# Patient Record
Sex: Male | Born: 1990 | Race: Black or African American | Hispanic: No | Marital: Single | Smoking: Current every day smoker
Health system: Southern US, Community
[De-identification: ages and names within clinical notes are randomized; demographics above are authoritative.]

## PROBLEM LIST (undated history)

## (undated) DIAGNOSIS — I493 Ventricular premature depolarization: Secondary | ICD-10-CM

## (undated) DIAGNOSIS — R002 Palpitations: Secondary | ICD-10-CM

## (undated) HISTORY — DX: Palpitations: R00.2

## (undated) HISTORY — DX: Ventricular premature depolarization: I49.3

## (undated) HISTORY — PX: NO PAST SURGERIES: SHX2092

---

## 2012-03-25 ENCOUNTER — Emergency Department (HOSPITAL_COMMUNITY)
Admission: EM | Admit: 2012-03-25 | Discharge: 2012-03-25 | Disposition: A | Discharge status: Home / Self Care | Payer: No Typology Code available for payment source | Attending: Emergency Medicine | Admitting: Emergency Medicine

## 2012-03-25 ENCOUNTER — Encounter (HOSPITAL_COMMUNITY): Payer: Self-pay | Admitting: Physical Medicine and Rehabilitation

## 2012-03-25 DIAGNOSIS — F17200 Nicotine dependence, unspecified, uncomplicated: Secondary | ICD-10-CM | POA: Insufficient documentation

## 2012-03-25 DIAGNOSIS — M549 Dorsalgia, unspecified: Secondary | ICD-10-CM | POA: Insufficient documentation

## 2012-03-25 MED ORDER — IBUPROFEN 600 MG PO TABS: 600.0000 mg | ORAL_TABLET | Freq: Four times a day (QID) | ORAL | Status: AC | PRN

## 2012-03-25 MED ORDER — CYCLOBENZAPRINE HCL 10 MG PO TABS: 10.0000 mg | ORAL_TABLET | Freq: Two times a day (BID) | ORAL | Status: AC | PRN

## 2012-03-25 NOTE — ED Notes (Signed)
Patient states denies rib pain 0/10 and now pain right wrist improved 5/10 achy
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
## 2012-03-28 NOTE — ED Provider Notes (Signed)
Medical screening examination/treatment/procedure(s) were performed by non-physician practitioner and as supervising physician I was immediately available for consultation/collaboration.   Shelda Jakes, MD 03/28/12 [phone]

## 2012-09-07 ENCOUNTER — Emergency Department (HOSPITAL_COMMUNITY)
Admission: EM | Admit: 2012-09-07 | Discharge: 2012-09-07 | Disposition: A | Discharge status: Home / Self Care | Payer: Self-pay | Attending: Emergency Medicine | Admitting: Emergency Medicine

## 2012-09-07 ENCOUNTER — Emergency Department (HOSPITAL_COMMUNITY): Payer: Self-pay

## 2012-09-07 ENCOUNTER — Encounter (HOSPITAL_COMMUNITY): Payer: Self-pay | Admitting: Nurse Practitioner

## 2012-09-07 DIAGNOSIS — IMO0002 Reserved for concepts with insufficient information to code with codable children: Secondary | ICD-10-CM | POA: Insufficient documentation

## 2012-09-07 DIAGNOSIS — S99929A Unspecified injury of unspecified foot, initial encounter: Secondary | ICD-10-CM | POA: Insufficient documentation

## 2012-09-07 DIAGNOSIS — S8990XA Unspecified injury of unspecified lower leg, initial encounter: Secondary | ICD-10-CM | POA: Insufficient documentation

## 2012-09-07 DIAGNOSIS — F17200 Nicotine dependence, unspecified, uncomplicated: Secondary | ICD-10-CM | POA: Insufficient documentation

## 2012-09-07 MED ORDER — NAPROXEN 500 MG PO TABS: 500.0000 mg | ORAL_TABLET | Freq: Two times a day (BID) | ORAL | Status: DC

## 2012-09-07 MED ORDER — HYDROCODONE-ACETAMINOPHEN 5-325 MG PO TABS
2.0000 | ORAL_TABLET | Freq: Once | ORAL | Status: AC
  Administered 2012-09-07: 2 via ORAL
  Filled 2012-09-07: qty 2

## 2012-09-07 MED ORDER — CEPHALEXIN 500 MG PO CAPS: 500.0000 mg | ORAL_CAPSULE | Freq: Four times a day (QID) | ORAL | Status: DC
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
## 2012-09-07 NOTE — Progress Notes (Signed)
Orthopedic Tech Progress Note Patient Details:  Warren Jones Nov 19, 1990 [id number]  Ortho Devices Type of Ortho Device: ASO Ortho Device/Splint Location: (R) LE Ortho Device/Splint Interventions: Application   Jennye Moccasin 09/07/2012, 5:43 PM

## 2012-09-08 NOTE — ED Provider Notes (Signed)
Medical screening examination/treatment/procedure(s) were performed by non-physician practitioner and as supervising physician I was immediately available for consultation/collaboration.   Carleene Cooper III, MD 09/08/12 [phone]

## 2013-02-08 ENCOUNTER — Encounter (HOSPITAL_COMMUNITY): Payer: Self-pay | Admitting: Emergency Medicine

## 2013-02-08 ENCOUNTER — Emergency Department (HOSPITAL_COMMUNITY)
Admission: EM | Admit: 2013-02-08 | Discharge: 2013-02-08 | Disposition: A | Discharge status: Home / Self Care | Payer: Self-pay | Attending: Emergency Medicine | Admitting: Emergency Medicine

## 2013-02-08 DIAGNOSIS — A64 Unspecified sexually transmitted disease: Secondary | ICD-10-CM

## 2013-02-08 DIAGNOSIS — N489 Disorder of penis, unspecified: Secondary | ICD-10-CM | POA: Insufficient documentation

## 2013-02-08 DIAGNOSIS — B009 Herpesviral infection, unspecified: Secondary | ICD-10-CM

## 2013-02-08 DIAGNOSIS — A5900 Urogenital trichomoniasis, unspecified: Secondary | ICD-10-CM | POA: Insufficient documentation

## 2013-02-08 DIAGNOSIS — A599 Trichomoniasis, unspecified: Secondary | ICD-10-CM

## 2013-02-08 DIAGNOSIS — A6001 Herpesviral infection of penis: Secondary | ICD-10-CM | POA: Insufficient documentation

## 2013-02-08 LAB — URINALYSIS, ROUTINE W REFLEX MICROSCOPIC
Bilirubin Urine: NEGATIVE
Glucose, UA: NEGATIVE mg/dL
Hgb urine dipstick: NEGATIVE
Ketones, ur: NEGATIVE mg/dL
pH: 6 (ref 5.0–8.0)

## 2013-02-08 LAB — URINE MICROSCOPIC-ADD ON

## 2013-02-08 MED ORDER — ACYCLOVIR 400 MG PO TABS: 400.0000 mg | ORAL_TABLET | Freq: Four times a day (QID) | ORAL | Status: DC

## 2013-02-08 MED ORDER — AZITHROMYCIN 250 MG PO TABS
1000.0000 mg | ORAL_TABLET | Freq: Once | ORAL | Status: AC
  Administered 2013-02-08: 1000 mg via ORAL
  Filled 2013-02-08: qty 4

## 2013-02-08 MED ORDER — LIDOCAINE HCL (PF) 1 % IJ SOLN
INTRAMUSCULAR | Status: AC
  Administered 2013-02-08: 5 mL
  Filled 2013-02-08: qty 5

## 2013-02-08 MED ORDER — METRONIDAZOLE 500 MG PO TABS
2000.0000 mg | ORAL_TABLET | Freq: Once | ORAL | Status: AC
  Administered 2013-02-08: 2000 mg via ORAL
  Filled 2013-02-08 (×2): qty 4

## 2013-02-08 MED ORDER — DOXYCYCLINE HYCLATE 100 MG PO CAPS: 100.0000 mg | ORAL_CAPSULE | Freq: Two times a day (BID) | ORAL | Status: DC

## 2013-02-08 MED ORDER — CEFTRIAXONE SODIUM 250 MG IJ SOLR
250.0000 mg | Freq: Once | INTRAMUSCULAR | Status: AC
  Administered 2013-02-08: 250 mg via INTRAMUSCULAR
  Filled 2013-02-08: qty 250

## 2013-02-08 NOTE — ED Notes (Signed)
Pt reports, "I have a sexually transmitted disease." Pt found out today that his girlfriend has trichomonas, she found out today. Pt reports irritation to penis. Unknown if he has any discharge.

## 2013-02-08 NOTE — ED Provider Notes (Signed)
History  This chart was scribed for non-physician practitioner, Felipa Furnace, working with Gavin Pound. Oletta Lamas, MD by Ardeen Jourdain, ED Scribe. This patient was seen in room TR06C/TR06C and the patient's care was started at 1707.  CSN: [id number]     Arrival date & time 02/08/13  1646   First MD Initiated Contact with Patient 02/08/13 1707     Chief Complaint  Patient presents with  . SEXUALLY TRANSMITTED DISEASE    The history is provided by the patient. No language interpreter was used.    HPI Comments: Warren Jones is a 22 y.o. male who presents to the Emergency Department complaining of exposure to a STD. He states his girl friend was recently diagnosed with trichomonas and wants to be tested. He reports irration to his penis. Pt states he noticed a bump on the tip of his penis 1 month ago. He noticed several other painful bumps after that time. No reported dysuria or hematuria. He has not tried anything to alleviate his symptoms. He denies fever, chills, nausea and vomiting.  He is not sure if he has any discharge.    History reviewed. No pertinent past medical history. History reviewed. No pertinent past surgical history. No family history on file. History  Substance Use Topics  . Smoking status: Never Smoker   . Smokeless tobacco: Not on file  . Alcohol Use: No    Review of Systems  Genitourinary: Positive for penile pain.  All other systems reviewed and are negative.    Allergies  Review of patient's allergies indicates no known allergies.  Home Medications   Current Outpatient Rx  Name  Route  Sig  Dispense  Refill  . acetaminophen (TYLENOL) 325 MG tablet   Oral   Take 325 mg by mouth every 6 (six) hours as needed for pain (toothache).          Triage Vitals: BP 120/77  Pulse 66  Temp(Src) 98.2 F (36.8 C) (Oral)  Resp 20  Ht 6\' 3"  (1.905 m)  Wt 165 lb 9 oz (75.099 kg)  BMI 20.69 kg/m2  SpO2 98%  Physical Exam  Nursing note and vitals  reviewed. Constitutional: He is oriented to person, place, and time. He appears well-developed and well-nourished. No distress.  HENT:  Head: Normocephalic and atraumatic.  Eyes: EOM are normal.  Neck: Neck supple. No tracheal deviation present.  Cardiovascular: Normal rate.   Pulmonary/Chest: Effort normal. No respiratory distress.  Genitourinary:  No penile discharge. 2 small vesicles on an erythematous base characteristic of herpes as diagrammed. Mild tenderness to the posterior pole of the right testicle. No masses, lumps or lesions. no abnormality of the penile shaft. Circumcised    Musculoskeletal: Normal range of motion.  Neurological: He is alert and oriented to person, place, and time.  Skin: Skin is warm and dry. He is not diaphoretic.  Psychiatric: He has a normal mood and affect. His behavior is normal.    ED Course   Procedures (including critical care time)  DIAGNOSTIC STUDIES: Oxygen Saturation is 98% on room air, normal by my interpretation.    COORDINATION OF CARE:  5:21 PM-Discussed treatment plan which includes GC/Chlamydia swab and UA with pt at bedside and pt agreed to plan.    Labs Reviewed  URINALYSIS, ROUTINE W REFLEX MICROSCOPIC - Abnormal; Notable for the following:    Leukocytes, UA TRACE (*)    All other components within normal limits  GC/CHLAMYDIA PROBE AMP  URINE MICROSCOPIC-ADD ON  No results found. 1. Trichimoniasis   2. STD (male)   3. Herpes     MDM  Patient with penile discharge.  Treated in the ED. He also has herpes. He was given treatment for this as well. Stable and ready for discharge.   I personally performed the services described in this documentation, which was scribed in my presence. The recorded information has been reviewed and is accurate.    Roxy Horseman, PA-C 02/09/13 0022

## 2013-02-10 NOTE — ED Provider Notes (Signed)
Medical screening examination/treatment/procedure(s) were performed by non-physician practitioner and as supervising physician I was immediately available for consultation/collaboration.  Rhiley Solem Y. Roiza Wiedel, MD 02/10/13 2332 

## 2013-04-14 ENCOUNTER — Encounter (HOSPITAL_COMMUNITY): Payer: Self-pay | Admitting: Adult Health

## 2013-04-14 ENCOUNTER — Emergency Department (HOSPITAL_COMMUNITY)
Admission: EM | Admit: 2013-04-14 | Discharge: 2013-04-15 | Disposition: A | Discharge status: Home / Self Care | Payer: No Typology Code available for payment source | Attending: Emergency Medicine | Admitting: Emergency Medicine

## 2013-04-14 ENCOUNTER — Emergency Department (HOSPITAL_COMMUNITY): Payer: No Typology Code available for payment source

## 2013-04-14 DIAGNOSIS — N50812 Left testicular pain: Secondary | ICD-10-CM

## 2013-04-14 DIAGNOSIS — N509 Disorder of male genital organs, unspecified: Secondary | ICD-10-CM | POA: Insufficient documentation

## 2013-04-14 DIAGNOSIS — Z8619 Personal history of other infectious and parasitic diseases: Secondary | ICD-10-CM | POA: Insufficient documentation

## 2013-04-14 MED ORDER — DEXTROSE 5 % IV SOLN: 500.0000 mg | Freq: Once | INTRAVENOUS | Status: DC

## 2013-04-14 MED ORDER — CEFTRIAXONE SODIUM 250 MG IJ SOLR
250.0000 mg | Freq: Once | INTRAMUSCULAR | Status: AC
  Administered 2013-04-15: 250 mg via INTRAMUSCULAR
  Filled 2013-04-14: qty 250

## 2013-04-14 NOTE — ED Notes (Signed)
Pt states that he has not experienced any trauma to the left testicle, but has had unprotected sex within the past 30 days. Pt is unsure of how long ago. Pt denies swelling.

## 2013-04-14 NOTE — ED Provider Notes (Signed)
CSN: [id number]     Arrival date & time 04/14/13  2119 History  This chart was scribed for non-physician practitioner [name], PA-C working with [name], MD by [name], ED Scribe. This patient was seen in room TR04C/TR04C and the patient's care was started at 2119.    Chief Complaint  Patient presents with  . Testicle Pain   The history is provided by the patient. No language interpreter was used.    HPI Comments: Warren Jones is a 22 y.o. male who presents to the Emergency Department complaining of 1 day of gradual onset, constant, waxing and waning left testicular pain. He describes the pain as pinching, rates it as 1/10 at rest but worse with movement and walking. He has not taken any OTC medication for this pain. He reports recent unprotected sex. He has a history of genital herpes but denies taking any antivirals. He denies any penile discharge, dysuria, frequency, abdominal pain, nausea, emesis, fever.   History reviewed. No pertinent past medical history. History reviewed. No pertinent past surgical history. History reviewed. No pertinent family history. History  Substance Use Topics  . Smoking status: Never Smoker   . Smokeless tobacco: Not on file  . Alcohol Use: No    Review of Systems  Constitutional: Negative for fever.  Gastrointestinal: Negative for nausea, vomiting and abdominal pain.  Genitourinary: Positive for testicular pain. Negative for dysuria, frequency and discharge.  All other systems reviewed and are negative.    Allergies  Review of patient's allergies indicates no known allergies.  Home Medications  No current outpatient prescriptions on file. BP 126/90  Pulse 78  Temp(Src) 99 F (37.2 C) (Oral)  Resp 16  Ht 6\' 3"  (1.905 m)  Wt 165 lb (74.844 kg)  BMI 20.62 kg/m2  SpO2 100% Physical Exam  Nursing note and vitals reviewed. Constitutional: He is oriented to person, place, and time. He appears well-developed and  well-nourished.  HENT:  Head: Normocephalic and atraumatic.  Eyes: EOM are normal.  Neck: Normal range of motion.  Cardiovascular: Normal rate.   Pulmonary/Chest: Effort normal.  Abdominal: Soft. Bowel sounds are normal. He exhibits no distension. There is no tenderness. Hernia confirmed negative in the right inguinal area and confirmed negative in the left inguinal area.  Genitourinary: Penis normal. Right testis shows no mass, no swelling and no tenderness. Right testis is descended. Cremasteric reflex is not absent on the right side. Left testis shows tenderness. Left testis shows no mass and no swelling. Left testis is descended. Cremasteric reflex is not absent on the left side. Circumcised. No phimosis, paraphimosis, hypospadias, penile erythema or penile tenderness. No discharge found.  Left testicle appears to sit higher than right (pt states new for him), moderate TTP. No erythema, ecchymosis or edema. No lesions. No inguinal hernia.  Musculoskeletal: Normal range of motion.  Lymphadenopathy:       Right: No inguinal adenopathy present.       Left: No inguinal adenopathy present.  Neurological: He is alert and oriented to person, place, and time.  Skin: Skin is warm and dry.  Psychiatric: He has a normal mood and affect. His behavior is normal.    ED Course  Procedures   DIAGNOSTIC STUDIES: Oxygen Saturation is 100% on RA, normal by my interpretation.    COORDINATION OF CARE: 11:59 PM Discussed treatment plan with pt at bedside and pt agreed to plan.   Labs Review Labs Reviewed  GC/CHLAMYDIA PROBE AMP   Imaging Review  US Scrotum  04/14/2013   CLINICAL DATA:  Testicular pain.  EXAM: SCROTAL ULTRASOUND  DOPPLER ULTRASOUND OF THE TESTICLES  TECHNIQUE: Complete ultrasound examination of the testicles, epididymis, and other scrotal structures was performed. Color and spectral Doppler ultrasound were also utilized to evaluate blood flow to the testicles.  COMPARISON:  None.   FINDINGS: Right testis: 4.0 x 1.9 x 3.1 cm. Normal echotexture. No focal abnormality. Normal arterial and venous blood flow.  Left testis: 3.5 x 1.7 x 2.3 cm. Normal echotexture. No focal abnormality. Normal arterial and venous blood flow.  Right epididymis:  Normal in size and appearance.  Left epididymis:  Normal in size and appearance.  Hydrocele:  Absent  Varicocele:  Present on the left.  Pulsed Doppler interrogation of both testes demonstrates low resistance arterial and venous waveforms bilaterally.  IMPRESSION: Testes symmetric and unremarkable.  Mild left varicocele.   Electronically Signed   By: Charlett Nose M.D.   On: 04/14/2013 23:44   [place] Art/ven Flow Abd Pelv Doppler  04/14/2013   CLINICAL DATA:  Testicular pain.  EXAM: SCROTAL ULTRASOUND  DOPPLER ULTRASOUND OF THE TESTICLES  TECHNIQUE: Complete ultrasound examination of the testicles, epididymis, and other scrotal structures was performed. Color and spectral Doppler ultrasound were also utilized to evaluate blood flow to the testicles.  COMPARISON:  None.  FINDINGS: Right testis: 4.0 x 1.9 x 3.1 cm. Normal echotexture. No focal abnormality. Normal arterial and venous blood flow.  Left testis: 3.5 x 1.7 x 2.3 cm. Normal echotexture. No focal abnormality. Normal arterial and venous blood flow.  Right epididymis:  Normal in size and appearance.  Left epididymis:  Normal in size and appearance.  Hydrocele:  Absent  Varicocele:  Present on the left.  Pulsed Doppler interrogation of both testes demonstrates low resistance arterial and venous waveforms bilaterally.  IMPRESSION: Testes symmetric and unremarkable.  Mild left varicocele.   Electronically Signed   By: Charlett Nose M.D.   On: 04/14/2013 23:44    MDM   1. Testicular pain, left    Pt reports hx of unprotected sex and hx of genital herpes. Reports left testicular pain that started yesterday. Left testicle is TTP, no edema. Sitting higher than right (new per pt since onset of pain).   Testicular US: testes symmetric and unremarkable, mild left varicocele.  No evidence of testicular torsion.  Will tx empirically for GC/chlamydia: Azithromycin and rocephin.  Swab sent for culture.  Discussed no sexual intercourse for 7 days. Have all partner(s) tested and treated. All labs/imaging/findings discussed with patient. All questions answered and concerns addressed. Will discharge pt home and have pt f/u with [facility] info provided. Return precautions given. Pt verbalized understanding and agreement with tx plan. Vitals: unremarkable. Discharged in stable condition.    Discussed pt with attending during ED encounter and agrees with plan.   I personally performed the services described in this documentation, which was scribed in my presence. The recorded information has been reviewed and is accurate.    [name], PA-C 04/15/13 0003

## 2013-04-14 NOTE — ED Notes (Signed)
Presents with left testicle pain, denies swelling. Pain rated 3./10, worse with movement. Pain does not radiate. Reports unprotected sex recently. Denies pain with urination,. Denies penile discharge.

## 2013-04-15 MED ORDER — LIDOCAINE HCL (PF) 1 % IJ SOLN
INTRAMUSCULAR | Status: AC
  Administered 2013-04-15: 1 mL
  Filled 2013-04-15: qty 5

## 2013-04-15 MED ORDER — AZITHROMYCIN 250 MG PO TABS
1000.0000 mg | ORAL_TABLET | Freq: Once | ORAL | Status: AC
  Administered 2013-04-15: 1000 mg via ORAL
  Filled 2013-04-15: qty 4

## 2013-04-15 NOTE — ED Provider Notes (Signed)
Medical screening examination/treatment/procedure(s) were performed by non-physician practitioner and as supervising physician I was immediately available for consultation/collaboration.   [name], MD 04/15/13 1154

## 2013-04-16 LAB — GC/CHLAMYDIA PROBE AMP
CT Probe RNA: NEGATIVE
GC Probe RNA: NEGATIVE

## 2013-12-13 ENCOUNTER — Encounter (HOSPITAL_COMMUNITY): Payer: Self-pay | Admitting: Emergency Medicine

## 2013-12-13 ENCOUNTER — Emergency Department (HOSPITAL_COMMUNITY)
Admission: EM | Admit: 2013-12-13 | Discharge: 2013-12-14 | Disposition: A | Discharge status: Home / Self Care | Payer: Self-pay | Attending: Emergency Medicine | Admitting: Emergency Medicine

## 2013-12-13 ENCOUNTER — Emergency Department (HOSPITAL_COMMUNITY): Payer: No Typology Code available for payment source

## 2013-12-13 DIAGNOSIS — Z23 Encounter for immunization: Secondary | ICD-10-CM | POA: Insufficient documentation

## 2013-12-13 DIAGNOSIS — IMO0002 Reserved for concepts with insufficient information to code with codable children: Secondary | ICD-10-CM

## 2013-12-13 DIAGNOSIS — T148XXA Other injury of unspecified body region, initial encounter: Secondary | ICD-10-CM

## 2013-12-13 DIAGNOSIS — S01409A Unspecified open wound of unspecified cheek and temporomandibular area, initial encounter: Secondary | ICD-10-CM | POA: Insufficient documentation

## 2013-12-13 LAB — CBC WITH DIFFERENTIAL/PLATELET
BASOS ABS: 0 10*3/uL (ref 0.0–0.1)
BASOS PCT: 0 % (ref 0–1)
Eosinophils Absolute: 0.1 10*3/uL (ref 0.0–0.7)
Eosinophils Relative: 2 % (ref 0–5)
HEMATOCRIT: 35.1 % — AB (ref 39.0–52.0)
HEMOGLOBIN: 12.5 g/dL — AB (ref 13.0–17.0)
LYMPHS PCT: 34 % (ref 12–46)
Lymphs Abs: 1.3 10*3/uL (ref 0.7–4.0)
MCH: 34.7 pg — ABNORMAL HIGH (ref 26.0–34.0)
MCHC: 35.6 g/dL (ref 30.0–36.0)
MCV: 97.5 fL (ref 78.0–100.0)
MONO ABS: 0.2 10*3/uL (ref 0.1–1.0)
MONOS PCT: 5 % (ref 3–12)
NEUTROS ABS: 2.3 10*3/uL (ref 1.7–7.7)
NEUTROS PCT: 59 % (ref 43–77)
Platelets: 130 10*3/uL — ABNORMAL LOW (ref 150–400)
RBC: 3.6 MIL/uL — ABNORMAL LOW (ref 4.22–5.81)
RDW: 12.1 % (ref 11.5–15.5)
WBC: 4 10*3/uL (ref 4.0–10.5)

## 2013-12-13 LAB — PROTIME-INR
INR: 1.13 (ref 0.00–1.49)
Prothrombin Time: 14.3 seconds (ref 11.6–15.2)

## 2013-12-13 LAB — COMPREHENSIVE METABOLIC PANEL
ALBUMIN: 3.9 g/dL (ref 3.5–5.2)
ALK PHOS: 49 U/L (ref 39–117)
ALT: 9 U/L (ref 0–53)
AST: 19 U/L (ref 0–37)
BILIRUBIN TOTAL: 0.3 mg/dL (ref 0.3–1.2)
BUN: 9 mg/dL (ref 6–23)
CHLORIDE: 104 meq/L (ref 96–112)
CO2: 23 meq/L (ref 19–32)
Calcium: 8.7 mg/dL (ref 8.4–10.5)
Creatinine, Ser: 0.9 mg/dL (ref 0.50–1.35)
GFR calc Af Amer: >90 mL/min (ref >90)
Glucose, Bld: 94 mg/dL (ref 70–99)
POTASSIUM: 3.6 meq/L — AB (ref 3.7–5.3)
Sodium: 140 mEq/L (ref 137–147)
Total Protein: 7.2 g/dL (ref 6.0–8.3)

## 2013-12-13 MED ORDER — HYDROMORPHONE HCL PF 1 MG/ML IJ SOLN
INTRAMUSCULAR | Status: AC
  Administered 2013-12-13: 1 mg
  Filled 2013-12-13: qty 1

## 2013-12-13 MED ORDER — TETANUS-DIPHTHERIA TOXOIDS TD 5-2 LFU IM INJ: 0.5000 mL | INJECTION | Freq: Once | INTRAMUSCULAR | Status: DC

## 2013-12-13 MED ORDER — HYDROCODONE-ACETAMINOPHEN 5-325 MG PO TABS
1.0000 | ORAL_TABLET | Freq: Once | ORAL | Status: AC
  Administered 2013-12-14: 1 via ORAL
  Filled 2013-12-13: qty 1

## 2013-12-13 MED ORDER — HYDROMORPHONE HCL PF 1 MG/ML IJ SOLN
1.0000 mg | Freq: Once | INTRAMUSCULAR | Status: AC
  Administered 2013-12-13: 1 mg via INTRAVENOUS
  Filled 2013-12-13: qty 1

## 2013-12-13 MED ORDER — TETANUS-DIPHTH-ACELL PERTUSSIS 5-2.5-18.5 LF-MCG/0.5 IM SUSP: 0.5000 mL | Freq: Once | INTRAMUSCULAR | Status: DC

## 2013-12-13 MED ORDER — IOHEXOL 350 MG/ML SOLN: 100.0000 mL | Freq: Once | INTRAVENOUS | Status: DC | PRN

## 2013-12-13 MED ORDER — ONDANSETRON HCL 4 MG/2ML IJ SOLN
4.0000 mg | Freq: Once | INTRAMUSCULAR | Status: AC
  Administered 2013-12-13: 4 mg via INTRAVENOUS
  Filled 2013-12-13: qty 2

## 2013-12-13 MED ORDER — TETANUS-DIPHTHERIA TOXOIDS TD 5-2 LFU IM INJ
0.5000 mL | INJECTION | Freq: Once | INTRAMUSCULAR | Status: DC
Start: 2013-12-13 — End: 2013-12-13

## 2013-12-13 MED ORDER — IOHEXOL 350 MG/ML SOLN
50.0000 mL | Freq: Once | INTRAVENOUS | Status: AC | PRN
  Administered 2013-12-13: 50 mL via INTRAVENOUS

## 2013-12-13 MED ORDER — TETANUS-DIPHTH-ACELL PERTUSSIS 5-2.5-18.5 LF-MCG/0.5 IM SUSP
0.5000 mL | Freq: Once | INTRAMUSCULAR | Status: AC
  Administered 2013-12-13: 0.5 mL via INTRAMUSCULAR

## 2013-12-13 MED ORDER — SODIUM CHLORIDE 0.9 % IV BOLUS (SEPSIS)
1000.0000 mL | Freq: Once | INTRAVENOUS | Status: AC
  Administered 2013-12-13: 1000 mL via INTRAVENOUS

## 2013-12-13 NOTE — ED Notes (Signed)
EMS VS 132/89, 94HR, 18 RR. No allergies
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
## 2013-12-13 NOTE — ED Notes (Signed)
C-collar removed by Dr. Denton Lank EDP

## 2013-12-13 NOTE — ED Notes (Signed)
MD at bedside. 
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
## 2013-12-14 NOTE — ED Notes (Signed)
Pt's necklace taken of at this time and placed in specimen cup, labeled with pt sticker and left at bedside
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
## 2013-12-20 ENCOUNTER — Emergency Department (HOSPITAL_COMMUNITY)
Admission: EM | Admit: 2013-12-20 | Discharge: 2013-12-20 | Disposition: A | Discharge status: Home / Self Care | Payer: No Typology Code available for payment source | Attending: Emergency Medicine | Admitting: Emergency Medicine

## 2013-12-20 ENCOUNTER — Encounter (HOSPITAL_COMMUNITY): Payer: Self-pay | Admitting: Emergency Medicine

## 2013-12-20 DIAGNOSIS — Z4802 Encounter for removal of sutures: Secondary | ICD-10-CM | POA: Insufficient documentation

## 2013-12-20 NOTE — ED Notes (Signed)
Pt presents with c/o stitch removal and wound check. Pt says that he has seen some pus coming from the wound and wanted to follow-up.

## 2013-12-20 NOTE — ED Provider Notes (Signed)
CSN: [id number]     Arrival date & time 12/20/13  2203 History   First MD Initiated Contact with Patient 12/20/13 2237    This chart was scribed for non-physician practitioner, Ebbie Ridge, PA, working with Hurman Horn, MD by Marica Otter, ED Scribe. This patient was seen in room WTR7/WTR7 and the patient's care was started at 10:50 PM.  Chief Complaint  Patient presents with  . Wound Check  . Suture / Staple Removal   HPI HPI Comments: Warren Jones is a 23 y.o. male who presents to the Emergency Department for stitch removal and wound check of his head and neck. Pt also complains of some discharge from the wound. PT does not know if he is UTD on his tetanus shot.   History reviewed. No pertinent past medical history. History reviewed. No pertinent past surgical history. No family history on file. History  Substance Use Topics  . Smoking status: Never Smoker   . Smokeless tobacco: Not on file  . Alcohol Use: No    Review of Systems  Skin: Positive for wound (wound check and stitch removal head and neck ).    A complete 10 system review of systems was obtained and all systems are negative except as noted in the HPI and PMH.    Allergies  Review of patient's allergies indicates no known allergies.  Home Medications   Prior to Admission medications   Not on File   Triage Vitals: BP 114/65  Pulse 73  Temp(Src) 97.8 F (36.6 C) (Oral)  Resp 18  SpO2 99%  Physical Exam  Nursing note and vitals reviewed. Constitutional: He is oriented to person, place, and time. He appears well-developed and well-nourished. No distress.  HENT:  Head: Normocephalic.  Patient has a well-healing laceration to the preauricular region on the left  Neck: Normal range of motion.  Cardiovascular: Normal rate.   Pulmonary/Chest: Effort normal. No respiratory distress.  Neurological: He is alert and oriented to person, place, and time.  Skin: Skin is warm and dry.    ED Course   Procedures (including critical care time) DIAGNOSTIC STUDIES: Oxygen Saturation is 99% on RA, normal by my interpretation.    COORDINATION OF CARE: 10:52 PM-Discussed treatment plan which includes steri strip care in the middle of the wound and suture removals with pt at bedside and pt agreed to plan.    I placed 3 Steri-Strips in the central portion of the wound.  The area was still somewhat unhealed.  I feel there was an end of  the suture that was underneath the skin that is causing the wound not heal in that area  [name], PA-C 12/21/13 [phone]

## 2013-12-20 NOTE — Discharge Instructions (Signed)
Return here as needed. Use scar cream on the area once it heals completely

## 2013-12-21 NOTE — ED Provider Notes (Signed)
Medical screening examination/treatment/procedure(s) were performed by non-physician practitioner and as supervising physician I was immediately available for consultation/collaboration.   EKG Interpretation None       Hurman Horn, MD 12/21/13 1425

## 2014-07-31 ENCOUNTER — Emergency Department (HOSPITAL_COMMUNITY)
Admission: EM | Admit: 2014-07-31 | Discharge: 2014-07-31 | Disposition: A | Discharge status: Home / Self Care | Payer: Self-pay | Attending: Emergency Medicine | Admitting: Emergency Medicine

## 2014-07-31 ENCOUNTER — Emergency Department (HOSPITAL_COMMUNITY): Payer: Self-pay

## 2014-07-31 ENCOUNTER — Encounter (HOSPITAL_COMMUNITY): Payer: Self-pay | Admitting: Emergency Medicine

## 2014-07-31 DIAGNOSIS — L989 Disorder of the skin and subcutaneous tissue, unspecified: Secondary | ICD-10-CM | POA: Insufficient documentation

## 2014-07-31 DIAGNOSIS — S60921A Unspecified superficial injury of right hand, initial encounter: Secondary | ICD-10-CM | POA: Insufficient documentation

## 2014-07-31 DIAGNOSIS — Z720 Tobacco use: Secondary | ICD-10-CM | POA: Insufficient documentation

## 2014-07-31 DIAGNOSIS — L089 Local infection of the skin and subcutaneous tissue, unspecified: Secondary | ICD-10-CM

## 2014-07-31 DIAGNOSIS — Y998 Other external cause status: Secondary | ICD-10-CM | POA: Insufficient documentation

## 2014-07-31 DIAGNOSIS — Y9239 Other specified sports and athletic area as the place of occurrence of the external cause: Secondary | ICD-10-CM | POA: Insufficient documentation

## 2014-07-31 DIAGNOSIS — X58XXXA Exposure to other specified factors, initial encounter: Secondary | ICD-10-CM | POA: Insufficient documentation

## 2014-07-31 DIAGNOSIS — J029 Acute pharyngitis, unspecified: Secondary | ICD-10-CM | POA: Insufficient documentation

## 2014-07-31 DIAGNOSIS — Y9371 Activity, boxing: Secondary | ICD-10-CM | POA: Insufficient documentation

## 2014-07-31 LAB — RAPID STREP SCREEN (MED CTR MEBANE ONLY): Streptococcus, Group A Screen (Direct): NEGATIVE

## 2014-07-31 MED ORDER — HYDROCODONE-ACETAMINOPHEN 5-325 MG PO TABS: 1.0000 | ORAL_TABLET | ORAL | Status: DC | PRN

## 2014-07-31 MED ORDER — AMOXICILLIN-POT CLAVULANATE 875-125 MG PO TABS
1.0000 | ORAL_TABLET | Freq: Two times a day (BID) | ORAL | Status: DC
Start: 2014-07-31 — End: 2016-04-25

## 2014-07-31 MED ORDER — AMOXICILLIN-POT CLAVULANATE 875-125 MG PO TABS
1.0000 | ORAL_TABLET | Freq: Once | ORAL | Status: AC
  Administered 2014-07-31: 1 via ORAL
  Filled 2014-07-31: qty 1

## 2014-07-31 MED ORDER — HYDROCODONE-ACETAMINOPHEN 5-325 MG PO TABS
2.0000 | ORAL_TABLET | Freq: Once | ORAL | Status: AC
  Administered 2014-07-31: 2 via ORAL
  Filled 2014-07-31: qty 2

## 2014-07-31 NOTE — ED Notes (Signed)
Pt at xray
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
## 2014-07-31 NOTE — Discharge Instructions (Signed)
Return to the emergency room with worsening of symptoms, new symptoms or with symptoms that are concerning, especially fevers, increased redness, swelling, pus from the wound, nausea, vomiting OR unable to open your mouth fully, drooling, facial, lip, tongue swelling, unable to swallow, difficulty breathing. Call to make an appointment with the hand surgeon as soon as possible. If you cannot be seen in 2 days come back to the ED for wound/infection reevaluation. Please take all of your antibiotics until finished!   You may develop abdominal discomfort or diarrhea from the antibiotic.  You may help offset this with probiotics which you can buy or get in yogurt. Do not eat  or take the probiotics until 2 hours after your antibiotic.  Drink plenty of fluids with electrolytes especially Gatorade. OTC cold medications such as mucinex, nyquil, dayquil are recommended. Chloraseptic for sore throat. Repeat low information and follow all instructions.  Wound Infection A wound infection happens when a type of germ (bacteria) starts growing in the wound. In some cases, this can cause the wound to break open. If cared for properly, the infected wound will heal from the inside to the outside. Wound infections need treatment. CAUSES An infection is caused by bacteria growing in the wound.  SYMPTOMS   Increase in redness, swelling, or pain at the wound site.  Increase in drainage at the wound site.  Wound or bandage (dressing) starts to smell bad.  Fever.  Feeling tired or fatigued.  Pus draining from the wound. TREATMENT  Your health care provider will prescribe antibiotic medicine. The wound infection should improve within 24 to 48 hours. Any redness around the wound should stop spreading and the wound should be less painful.  HOME CARE INSTRUCTIONS   Only take over-the-counter or prescription medicines for pain, discomfort, or fever as directed by your health care provider.  Take your antibiotics as  directed. Finish them even if you start to feel better.  Gently wash the area with mild soap and water 2 times a day, or as directed. Rinse off the soap. Pat the area dry with a clean towel. Do not rub the wound. This may cause bleeding.  Follow your health care provider's instructions for how often you need to change the dressing.  Apply ointment and a dressing to the wound as directed.  If the dressing sticks, moisten it with soapy water and gently remove it.  Change the bandage right away if it becomes wet, dirty, or develops a bad smell.  Take showers. Do not take tub baths, swim, or do anything that may soak the wound until it is healed.  Avoid exercises that make you sweat heavily.  Use anti-itch medicine as directed by your health care provider. The wound may itch when it is healing. Do not pick or scratch at the wound.  Follow up with your health care provider to get your wound rechecked as directed. SEEK MEDICAL CARE IF:  You have an increase in swelling, pain, or redness around the wound.  You have an increase in the amount of pus coming from the wound.  There is a bad smell coming from the wound.  More of the wound breaks open.  You have a fever. MAKE SURE YOU:   Understand these instructions.  Will watch your condition.  Will get help right away if you are not doing well or get worse. Document Released: 04/01/2003 Document Revised: 07/08/2013 Document Reviewed: 11/06/2010 [facility] Patient Information 2015 [place], [place]. This information is not intended to  replace advice given to you by your health care provider. Make sure you discuss any questions you have with your health care provider.  Pharyngitis Pharyngitis is redness, pain, and swelling (inflammation) of your pharynx.  CAUSES  Pharyngitis is usually caused by infection. Most of the time, these infections are from viruses (viral) and are part of a cold. However, sometimes pharyngitis is caused by bacteria  (bacterial). Pharyngitis can also be caused by allergies. Viral pharyngitis may be spread from person to person by coughing, sneezing, and personal items or utensils (cups, forks, spoons, toothbrushes). Bacterial pharyngitis may be spread from person to person by more intimate contact, such as kissing.  SIGNS AND SYMPTOMS  Symptoms of pharyngitis include:   Sore throat.   Tiredness (fatigue).   Low-grade fever.   Headache.  Joint pain and muscle aches.  Skin rashes.  Swollen lymph nodes.  Plaque-like film on throat or tonsils (often seen with bacterial pharyngitis). DIAGNOSIS  Your health care provider will ask you questions about your illness and your symptoms. Your medical history, along with a physical exam, is often all that is needed to diagnose pharyngitis. Sometimes, a rapid strep test is done. Other lab tests may also be done, depending on the suspected cause.  TREATMENT  Viral pharyngitis will usually get better in 3-4 days without the use of medicine. Bacterial pharyngitis is treated with medicines that kill germs (antibiotics).  HOME CARE INSTRUCTIONS   Drink enough water and fluids to keep your urine clear or pale yellow.   Only take over-the-counter or prescription medicines as directed by your health care provider:   If you are prescribed antibiotics, make sure you finish them even if you start to feel better.   Do not take aspirin.   Get lots of rest.   Gargle with 8 oz of salt water ( tsp of salt per 1 qt of water) as often as every 1-2 hours to soothe your throat.   Throat lozenges (if you are not at risk for choking) or sprays may be used to soothe your throat. SEEK MEDICAL CARE IF:   You have large, tender lumps in your neck.  You have a rash.  You cough up Flink, yellow-brown, or bloody spit. SEEK IMMEDIATE MEDICAL CARE IF:   Your neck becomes stiff.  You drool or are unable to swallow liquids.  You vomit or are unable to keep  medicines or liquids down.  You have severe pain that does not go away with the use of recommended medicines.  You have trouble breathing (not caused by a stuffy nose). MAKE SURE YOU:   Understand these instructions.  Will watch your condition.  Will get help right away if you are not doing well or get worse. Document Released: 07/03/2005 Document Revised: 04/23/2013 Document Reviewed: 03/10/2013 [facility] Patient Information 2015 [place], [place]. This information is not intended to replace advice given to you by your health care provider. Make sure you discuss any questions you have with your health care provider.
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
## 2014-08-02 LAB — CULTURE, GROUP A STREP
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
## 2016-04-24 ENCOUNTER — Emergency Department (HOSPITAL_COMMUNITY)
Admission: EM | Admit: 2016-04-24 | Discharge: 2016-04-25 | Disposition: A | Discharge status: Home / Self Care | Payer: Self-pay | Attending: Emergency Medicine | Admitting: Emergency Medicine

## 2016-04-24 ENCOUNTER — Encounter (HOSPITAL_COMMUNITY): Payer: Self-pay | Admitting: Emergency Medicine

## 2016-04-24 DIAGNOSIS — T405X1A Poisoning by cocaine, accidental (unintentional), initial encounter: Secondary | ICD-10-CM | POA: Insufficient documentation

## 2016-04-24 DIAGNOSIS — F17210 Nicotine dependence, cigarettes, uncomplicated: Secondary | ICD-10-CM | POA: Insufficient documentation

## 2016-04-24 DIAGNOSIS — T50901A Poisoning by unspecified drugs, medicaments and biological substances, accidental (unintentional), initial encounter: Secondary | ICD-10-CM

## 2016-04-24 DIAGNOSIS — Z5181 Encounter for therapeutic drug level monitoring: Secondary | ICD-10-CM | POA: Insufficient documentation

## 2016-04-24 LAB — COMPREHENSIVE METABOLIC PANEL
ALK PHOS: 53 U/L (ref 38–126)
ALT: 14 U/L — ABNORMAL LOW (ref 17–63)
AST: 27 U/L (ref 15–41)
Albumin: 4.6 g/dL (ref 3.5–5.0)
Anion gap: 7 (ref 5–15)
BUN: 7 mg/dL (ref 6–20)
CALCIUM: 9.1 mg/dL (ref 8.9–10.3)
CO2: 26 mmol/L (ref 22–32)
Chloride: 105 mmol/L (ref 101–111)
Creatinine, Ser: 0.98 mg/dL (ref 0.61–1.24)
GFR calc non Af Amer: >60 mL/min (ref >60)
GLUCOSE: 118 mg/dL — AB (ref 65–99)
Potassium: 2.9 mmol/L — ABNORMAL LOW (ref 3.5–5.1)
SODIUM: 138 mmol/L (ref 135–145)
Total Bilirubin: 0.6 mg/dL (ref 0.3–1.2)
Total Protein: 8.3 g/dL — ABNORMAL HIGH (ref 6.5–8.1)

## 2016-04-24 LAB — CBC
HEMATOCRIT: 39.5 % (ref 39.0–52.0)
Hemoglobin: 13.8 g/dL (ref 13.0–17.0)
MCH: 34 pg (ref 26.0–34.0)
MCHC: 34.9 g/dL (ref 30.0–36.0)
MCV: 97.3 fL (ref 78.0–100.0)
PLATELETS: 168 10*3/uL (ref 150–400)
RBC: 4.06 MIL/uL — ABNORMAL LOW (ref 4.22–5.81)
RDW: 12.7 % (ref 11.5–15.5)
WBC: 4.8 10*3/uL (ref 4.0–10.5)

## 2016-04-24 LAB — ETHANOL

## 2016-04-24 LAB — CBG MONITORING, ED: Glucose-Capillary: 117 mg/dL — ABNORMAL HIGH (ref 65–99)

## 2016-04-24 LAB — ACETAMINOPHEN LEVEL: Acetaminophen (Tylenol), Serum: <10 ug/mL — ABNORMAL LOW (ref 10–30)

## 2016-04-24 LAB — SALICYLATE LEVEL: Salicylate Lvl: <7 mg/dL (ref 2.8–30.0)

## 2016-04-24 MED ORDER — LORAZEPAM 2 MG/ML IJ SOLN
2.0000 mg | Freq: Once | INTRAMUSCULAR | Status: AC
  Administered 2016-04-24: 2 mg via INTRAVENOUS
  Filled 2016-04-24: qty 1

## 2016-04-24 MED ORDER — CHARCOAL ACTIVATED PO LIQD
72.0000 g | Freq: Once | ORAL | Status: AC
  Administered 2016-04-24: 72 g via ORAL
  Filled 2016-04-24: qty 480

## 2016-04-24 NOTE — ED Provider Notes (Signed)
[facility]-EMERGENCY DEPT Provider Note   CSN: [id number] Arrival date & time: 04/24/16  2133     History   Chief Complaint Chief Complaint  Patient presents with  . Drug Overdose    HPI Warren Jones is a 25 y.o. male.  He presents for evaluation of ingestion of cocaine. He was apparently trying to avoid being caught with possession of cocaine, when police officers came to his domicile. He states that he ate 20 g of cocaine. He reports that he "spit out" about 3 g. The form of ingested cocaine is, "rock". Please officers have about 20 pieces of varying size of cocaine rocks, up to 1 cm. He denies chest pain, shortness of breath, weakness or dizziness at this time. He came here, with police officers. No prior similar problem. No recent illnesses. There are no other known modifying factors.  HPI  History reviewed. No pertinent past medical history.  There are no active problems to display for this patient.   History reviewed. No pertinent surgical history.     Home Medications    Prior to Admission medications   Medication Sig Start Date End Date Taking? Authorizing Provider  amoxicillin-clavulanate (AUGMENTIN) 875-125 MG per tablet Take 1 tablet by mouth every 12 (twelve) hours. Patient not taking: Reported on 04/24/2016 07/31/14   Oswaldo Conroy, PA-C  HYDROcodone-acetaminophen (NORCO/VICODIN) 5-325 MG per tablet Take 1-2 tablets by mouth every 4 (four) hours as needed for moderate pain or severe pain. Patient not taking: Reported on 04/24/2016 07/31/14   Oswaldo Conroy, PA-C    Family History History reviewed. No pertinent family history.  Social History Social History  Substance Use Topics  . Smoking status: Current Every Day Smoker    Packs/day: 0.50    Types: Cigarettes  . Smokeless tobacco: Never Used  . Alcohol use Yes     Comment: 2 bottles of liquor daily     Allergies   Review of patient's allergies indicates no known allergies.   Review of  Systems Review of Systems  All other systems reviewed and are negative.    Physical Exam Updated Vital Signs BP (!) 157/107   Pulse 85   Temp 99.5 F (37.5 C) (Rectal)   Resp 23   Ht 6\' 2"  (1.88 m)   Wt 160 lb (72.6 kg)   SpO2 100%   BMI 20.54 kg/m   Physical Exam  Constitutional: He is oriented to person, place, and time. He appears well-developed and well-nourished. No distress.  HENT:  Head: Normocephalic and atraumatic.  Right Ear: External ear normal.  Left Ear: External ear normal.  Eyes: Conjunctivae and EOM are normal. Pupils are equal, round, and reactive to light.  Neck: Normal range of motion and phonation normal. Neck supple.  Cardiovascular: Regular rhythm and normal heart sounds.   Mild tachycardia and hypertension, initially  Pulmonary/Chest: Effort normal and breath sounds normal. He exhibits no bony tenderness.  Abdominal: Soft. There is no tenderness.  Musculoskeletal: Normal range of motion.  Neurological: He is alert and oriented to person, place, and time. No cranial nerve deficit or sensory deficit. He exhibits normal muscle tone. Coordination normal.  Skin: Skin is warm, dry and intact.  Psychiatric: His behavior is normal. Judgment and thought content normal.  Anxious  Nursing note and vitals reviewed.    ED Treatments / Results  Labs (all labs ordered are listed, but only abnormal results are displayed) Labs Reviewed  COMPREHENSIVE METABOLIC PANEL - Abnormal; Notable for the following:  Result Value   Potassium 2.9 (*)    Glucose, Bld 118 (*)    Total Protein 8.3 (*)    ALT 14 (*)    All other components within normal limits  ACETAMINOPHEN LEVEL - Abnormal; Notable for the following:    Acetaminophen (Tylenol), Serum <10 (*)    All other components within normal limits  CBC - Abnormal; Notable for the following:    RBC 4.06 (*)    All other components within normal limits  RAPID URINE DRUG SCREEN, HOSP PERFORMED - Abnormal;  Notable for the following:    Cocaine POSITIVE (*)    Benzodiazepines POSITIVE (*)    Tetrahydrocannabinol POSITIVE (*)    All other components within normal limits  CBG MONITORING, ED - Abnormal; Notable for the following:    Glucose-Capillary 117 (*)    All other components within normal limits  ETHANOL  SALICYLATE LEVEL  CBG MONITORING, ED    EKG  EKG Interpretation None        Date: 04/24/16  Rate: 83  Rhythm: normal sinus rhythm  QRS Axis: normal  PR and QT Intervals: normal  ST/T Wave abnormalities: normal  PR and QRS Conduction Disutrbances:none  Narrative Interpretation:   Old EKG Reviewed: none available    Radiology No results found.  Procedures Procedures (including critical care time)  Medications Ordered in ED Medications  charcoal activated (NO SORBITOL) (ACTIDOSE-AQUA) suspension 72 g (72 g Oral Given 04/24/16 2203)  LORazepam (ATIVAN) injection 2 mg (2 mg Intravenous Given 04/24/16 2238)  LORazepam (ATIVAN) injection 2 mg (2 mg Intravenous Given 04/24/16 2317)     Initial Impression / Assessment and Plan / ED Course  I have reviewed the triage vital signs and the nursing notes.  Pertinent labs & imaging results that were available during my care of the patient were reviewed by me and considered in my medical decision making (see chart for details).  Clinical Course    Medications  charcoal activated (NO SORBITOL) (ACTIDOSE-AQUA) suspension 72 g (72 g Oral Given 04/24/16 2203)  LORazepam (ATIVAN) injection 2 mg (2 mg Intravenous Given 04/24/16 2238)  LORazepam (ATIVAN) injection 2 mg (2 mg Intravenous Given 04/24/16 2317)    Patient Vitals for the past 24 hrs:  BP Temp Temp src Pulse Resp SpO2 Height Weight  04/25/16 0200 (!) 157/107 - - 85 23 100 % - -  04/25/16 0151 159/85 - - 82 13 99 % - -  04/25/16 0130 173/93 - - 69 26 94 % - -  04/25/16 0040 132/93 - - 89 16 98 % - -  04/25/16 0030 153/93 - - 82 15 97 % - -  04/25/16 0019 - 99.5 F  (37.5 C) Rectal - - - - -  04/25/16 0000 177/99 - - 92 25 100 % - -  04/24/16 2350 160/85 - - 95 24 99 % - -  04/24/16 2330 143/92 - - 90 26 99 % - -  04/24/16 2300 173/91 - - 109 19 98 % - -  04/24/16 2246 - 99.5 F (37.5 C) Rectal - - - - -  04/24/16 2245 (!) 194/106 - - 88 21 99 % - -  04/24/16 2230 (!) 202/106 - - 95 18 100 % - -  04/24/16 2215 197/96 - - - 23 - - -  04/24/16 2201 174/88 - - - 20 - - -  04/24/16 2146 (!) 153/114 - - - 18 - - -  04/24/16 2145 - - -  83 17 100 % - -  04/24/16 2143 - - - 88 24 100 % - -  04/24/16 2142 (!) 133/111 - - - - - - -  04/24/16 2136 168/94 98.2 F (36.8 C) Oral 101 20 100 % 6\' 2"  (1.88 m) 160 lb (72.6 kg)  04/24/16 2134 - - - - - 95 % - -   21:50- I discussed the case in its entirety, insofar as what is known at this time, with poison control. I asked them specifically about gastric lavage and they stated that it is not in the protocol. They also do not see a need for whole bowel irrigation. I have placed a page to gastroenterology to see about endoscopy for removal, of cocaine particles.  22:00- Poison control was called back, and stayed there is no indication for involving gastroenterology or lavage at this time. Lavage has been shown to worsen these types of ingestions. They again reviewed related that the treatment of choice is charcoal without sorbitol, followed by supportive therapy, with fluids, benzodiazepines, and cooling if needed for hyperthermia.  22:10- case discussed with gastroenterology, Dr. Matthias Hughs. He agrees with the recommendation for no endoscopy at this time. He is able to assist if needed, for problems which might arise.  10:31 PM Reevaluation with update and discussion. After initial assessment and treatment, an updated evaluation reveals he is tremulous at this time. IVAtivan ordered. Cecia Egge L   22:40- patient had an episode of shaking, followed by unresponsiveness, which nursing thought was related to a "seizure".  When I arrived in the room, he was alert, communicative, not shaking and appeared somewhat sedated after receiving the Ativan.  0200- He has continued to remain alert, and is occasionally tremulous, but this comes and goes. Vital signs are reassuring. He will require observation until 0400 hours, at which point he can be discharged. Police officers are still here, waiting for his medical clearance and plan on taking him to jail.  CRITICAL CARE Performed by: Flint Melter Total critical care time: 75 minutes Critical care time was exclusive of separately billable procedures and treating other patients. Critical care was necessary to treat or prevent imminent or life-threatening deterioration. Critical care was time spent personally by me on the following activities: development of treatment plan with patient and/or surrogate as well as nursing, discussions with consultants, evaluation of patient's response to treatment, examination of patient, obtaining history from patient or surrogate, ordering and performing treatments and interventions, ordering and review of laboratory studies, ordering and review of radiographic studies, pulse oximetry and re-evaluation of patient's condition.   Final Clinical Impressions(s) / ED Diagnoses   Final diagnoses:  Accidental drug ingestion, initial encounter    Intentional cocaine ingestion, to avoid prosecution.  Nursing Notes Reviewed/ Care Coordinated, and agree without changes. Applicable Imaging Reviewed.  Interpretation of Laboratory Data incorporated into ED treatment  Plan- observe until 0400 hours, then discharge if he remains stable.    New Prescriptions New Prescriptions   No medications on file     Mancel Bale, MD 04/25/16 [phone]

## 2016-04-24 NOTE — ED Notes (Signed)
Pt regained consciousness after seizing; pt informed of events; MD informed pt that it would be "a rough road ahead" and that he needed to drink the activated charcoal; pt states "Nah, I'm good".

## 2016-04-24 NOTE — ED Notes (Signed)
Bed: RESA Expected date:  Expected time:  Means of arrival:  Comments: EMS swallowed cocaine

## 2016-04-24 NOTE — ED Notes (Addendum)
Pt had witnessed seizure lasting about 2 minutes; no injuries noted; pt nonresponsive to voice during this period; generalized shaking of extremities with focal stare; diaphoretic; MD aware and at bedside

## 2016-04-24 NOTE — ED Triage Notes (Signed)
Per EMS/PD pt reported to ingest appx 20grams of cocaine at 2045 tonight. Pt parole officer found pt when ingestion occurred. Pt currently alert and oriented x 4. EMS advised pt spit out some of ingested cocaine. [employer] at bedside with pt.

## 2016-04-24 NOTE — ED Notes (Signed)
MD at bedside. 

## 2016-04-24 NOTE — ED Notes (Signed)
Pt took one sip of activated charcoal

## 2016-04-24 NOTE — ED Notes (Signed)
Pt refused to hang up cell phone when this nurse was discussing importance of consuming activated charcoal; pt states "I feel fine, I don't think I need to drink that"; this nurse explained that both the ER MD and Poison Control recommended drinking activated charcoal and that refusal to do so would increase risk of adverse effects including death; pt repeated that he felt fine and that he "wasn't refusing it, just didn't want to drink it"; during this interaction pt refused to hang up phone and continued having conversation with friend, despite this nurse asking for his full attention

## 2016-04-24 NOTE — ED Notes (Signed)
Pt encouraged to take more sips of activated charcoal; pt states "I did. I took 3 sips". Charge nurse reiterated that if pt did not consume activated charcoal he could end up dying; pt took another sip of activated charcoal

## 2016-04-24 NOTE — ED Notes (Signed)
Verbal order for 2mg  Ativan IVP given.

## 2016-04-25 LAB — RAPID URINE DRUG SCREEN, HOSP PERFORMED
Amphetamines: NOT DETECTED
Barbiturates: NOT DETECTED
Benzodiazepines: POSITIVE — AB
COCAINE: POSITIVE — AB
OPIATES: NOT DETECTED
TETRAHYDROCANNABINOL: POSITIVE — AB

## 2016-04-25 NOTE — ED Notes (Signed)
Pt completed full dose of activated charcoal at this time

## 2016-04-25 NOTE — ED Notes (Signed)
Pt  Completed half dose of activated charcoal at this time

## 2016-04-25 NOTE — ED Notes (Signed)
Pt given paper scrubs and probation officers at bedside with pt. Currently awaiting [employer] for discharge of pt to jail

## 2016-04-25 NOTE — ED Notes (Signed)
Per Poison Control:  If pt does not have any more symptoms requiring medication by 4am, pt can be discharged

## 2016-04-25 NOTE — ED Provider Notes (Signed)
Patient initially seen and evaluated by Dr. Effie ShyWentz, in the ED for management of cocaine overdose. Treatment of activated charcoal administered per poison control recommendations. He was observed in the ED with no toxic effects noted. Following suitable period of observation, he was felt to be medically cleared with concurrence of poison control. He was discharged into police custody.   Dione Boozeavid Takyia Sindt, MD 04/25/16 [phone]

## 2016-04-25 NOTE — ED Notes (Signed)
MD at bedside. 

## 2018-02-19 ENCOUNTER — Other Ambulatory Visit: Payer: Self-pay

## 2018-02-19 ENCOUNTER — Ambulatory Visit (HOSPITAL_COMMUNITY)
Admission: EM | Admit: 2018-02-19 | Discharge: 2018-02-19 | Disposition: A | Discharge status: Home / Self Care | Payer: Self-pay | Attending: Family Medicine | Admitting: Family Medicine

## 2018-02-19 ENCOUNTER — Encounter (HOSPITAL_COMMUNITY): Payer: Self-pay

## 2018-02-19 DIAGNOSIS — R590 Localized enlarged lymph nodes: Secondary | ICD-10-CM

## 2018-02-19 NOTE — ED Triage Notes (Signed)
Pt c/o right ear swelling in the inside. 4 days

## 2018-02-19 NOTE — Discharge Instructions (Signed)
Please take tylenol and ibuprofen for pain/swelling  Establish care with a primary provider if lymph node staying enlarged  Return if developing redness, increased pain or swelling to area

## 2018-02-19 NOTE — ED Provider Notes (Signed)
[facility]    CSN: [id number] Arrival date & time: 02/19/18  1437     History   Chief Complaint Chief Complaint  Patient presents with  . Otalgia    HPI Warren Jones is a 27 y.o. male no contributing past medical history presenting today for evaluation of right ear swelling.  Patient has noticed a swelling in front of his right ear for the past 4 days.  It is associated with tenderness when touching, but does not have pain at baseline.  He feels like it has slightly altered his hearing.  Denies pain with his left ear.  Denies associated URI symptoms of congestion, sore throat or coughing.  Denies noticing any of these bumps anywhere else on his body.  Denies fevers, night sweats. HPI  History reviewed. No pertinent past medical history.  There are no active problems to display for this patient.   History reviewed. No pertinent surgical history.     Home Medications    Prior to Admission medications   Not on File    Family History Family History  Problem Relation Age of Onset  . Healthy Mother     Social History Social History   Tobacco Use  . Smoking status: Current Every Day Smoker    Packs/day: 0.50    Types: Cigarettes  . Smokeless tobacco: Never Used  Substance Use Topics  . Alcohol use: Yes    Comment: 2 bottles of liquor daily  . Drug use: Yes    Types: Marijuana, Cocaine     Allergies   Patient has no known allergies.   Review of Systems Review of Systems  Constitutional: Negative for activity change, appetite change, chills, fatigue and fever.  HENT: Positive for ear pain. Negative for congestion, rhinorrhea, sinus pressure, sore throat and trouble swallowing.   Eyes: Negative for discharge and redness.  Respiratory: Negative for cough, chest tightness and shortness of breath.   Cardiovascular: Negative for chest pain.  Gastrointestinal: Negative for abdominal pain, diarrhea, nausea and vomiting.  Musculoskeletal: Negative  for myalgias.  Skin: Negative for rash.  Neurological: Negative for dizziness, light-headedness and headaches.     Physical Exam Triage Vital Signs ED Triage Vitals  Enc Vitals Group     BP 02/19/18 1502 114/64     Pulse Rate 02/19/18 1502 69     Resp 02/19/18 1502 16     Temp 02/19/18 1502 97.8 F (36.6 C)     Temp src --      SpO2 02/19/18 1502 100 %     Weight 02/19/18 1503 175 lb (79.4 kg)     Height --      Head Circumference --      Peak Flow --      Pain Score 02/19/18 1503 3     Pain Loc --      Pain Edu? --      Excl. in GC? --    No data found.  Updated Vital Signs BP 114/64   Pulse 69   Temp 97.8 F (36.6 C)   Resp 16   Wt 175 lb (79.4 kg)   SpO2 100%   BMI 22.47 kg/m   Visual Acuity Right Eye Distance:   Left Eye Distance:   Bilateral Distance:    Right Eye Near:   Left Eye Near:    Bilateral Near:     Physical Exam  Constitutional: He appears well-developed and well-nourished.  HENT:  Head: Normocephalic and atraumatic.  Bilateral  ears without tenderness to palpation of external auricle, tragus and mastoid, EAC's without erythema or swelling, TM's with good bony landmarks and cone of light. Non erythematous.  Enlarged preauricular lymph node on right side  Oral mucosa pink and moist, no tonsillar enlargement or exudate. Posterior pharynx patent and nonerythematous, no uvula deviation or swelling. Normal phonation.   Eyes: Conjunctivae are normal.  Neck: Neck supple.  Cardiovascular: Normal rate and regular rhythm.  No murmur heard. Pulmonary/Chest: Effort normal and breath sounds normal. No respiratory distress.  Breathing comfortably at rest, CTA BL  Abdominal: Soft. There is no tenderness.  Musculoskeletal: He exhibits no edema.  Neurological: He is alert.  Skin: Skin is warm and dry.  Psychiatric: He has a normal mood and affect.  Nursing note and vitals reviewed.    UC Treatments / Results  Labs (all labs ordered are listed,  but only abnormal results are displayed) Labs Reviewed - No data to display  EKG None  Radiology No results found.  Procedures Procedures (including critical care time)  Medications Ordered in UC Medications - No data to display  Initial Impression / Assessment and Plan / UC Course  I have reviewed the triage vital signs and the nursing notes.  Pertinent labs & imaging results that were available during my care of the patient were reviewed by me and considered in my medical decision making (see chart for details).     Patient with preauricular lymphadenopathy for 4 days, will recommend symptomatic management with anti-inflammatories.  Advised to follow-up/establish care with PCP for further evaluation of lymph node remaining enlarged greater than 2 weeks.  Return if developing redness, increased pain or swelling.  Return for fevers.Discussed strict return precautions. Patient verbalized understanding and is agreeable with plan.  Final Clinical Impressions(s) / UC Diagnoses   Final diagnoses:  Lymphadenopathy, preauricular     Discharge Instructions     Please take tylenol and ibuprofen for pain/swelling  Establish care with a primary provider if lymph node staying enlarged  Return if developing redness, increased pain or swelling to area   ED Prescriptions    None     Controlled Substance Prescriptions [place] Controlled Substance Registry consulted? Not Applicable   Lew DawesWieters, Wally Shevchenko C, [place]PA-C 02/19/18 1530

## 2018-10-04 ENCOUNTER — Encounter (HOSPITAL_COMMUNITY): Payer: Self-pay | Admitting: Emergency Medicine

## 2018-10-04 ENCOUNTER — Other Ambulatory Visit: Payer: Self-pay

## 2018-10-04 ENCOUNTER — Emergency Department (HOSPITAL_COMMUNITY)
Admission: EM | Admit: 2018-10-04 | Discharge: 2018-10-05 | Disposition: A | Discharge status: Home / Self Care | Payer: Self-pay | Attending: Emergency Medicine | Admitting: Emergency Medicine

## 2018-10-04 DIAGNOSIS — F17210 Nicotine dependence, cigarettes, uncomplicated: Secondary | ICD-10-CM | POA: Insufficient documentation

## 2018-10-04 DIAGNOSIS — R002 Palpitations: Secondary | ICD-10-CM

## 2018-10-04 DIAGNOSIS — I493 Ventricular premature depolarization: Secondary | ICD-10-CM | POA: Insufficient documentation

## 2018-10-04 NOTE — ED Triage Notes (Signed)
C/o heart racing since yesterday.  Denies any other symptoms.  Smoked marijuana yesterday and today.

## 2018-10-05 ENCOUNTER — Other Ambulatory Visit: Payer: Self-pay

## 2018-10-05 LAB — COMPREHENSIVE METABOLIC PANEL
ALK PHOS: 46 U/L (ref 38–126)
ALT: 13 U/L (ref 0–44)
AST: 17 U/L (ref 15–41)
Albumin: 4 g/dL (ref 3.5–5.0)
Anion gap: 8 (ref 5–15)
BUN: <5 mg/dL — ABNORMAL LOW (ref 6–20)
CO2: 23 mmol/L (ref 22–32)
Calcium: 9.3 mg/dL (ref 8.9–10.3)
Chloride: 107 mmol/L (ref 98–111)
Creatinine, Ser: 1.01 mg/dL (ref 0.61–1.24)
GFR calc Af Amer: >60 mL/min (ref >60)
GFR calc non Af Amer: >60 mL/min (ref >60)
Glucose, Bld: 89 mg/dL (ref 70–99)
Potassium: 3.6 mmol/L (ref 3.5–5.1)
SODIUM: 138 mmol/L (ref 135–145)
Total Bilirubin: 0.4 mg/dL (ref 0.3–1.2)
Total Protein: 7.1 g/dL (ref 6.5–8.1)

## 2018-10-05 LAB — CBC WITH DIFFERENTIAL/PLATELET
Abs Immature Granulocytes: 0.01 10*3/uL (ref 0.00–0.07)
Basophils Absolute: 0 10*3/uL (ref 0.0–0.1)
Basophils Relative: 1 %
Eosinophils Absolute: 0.2 10*3/uL (ref 0.0–0.5)
Eosinophils Relative: 4 %
HCT: 37.8 % — ABNORMAL LOW (ref 39.0–52.0)
Hemoglobin: 12.7 g/dL — ABNORMAL LOW (ref 13.0–17.0)
Immature Granulocytes: 0 %
LYMPHS ABS: 2.6 10*3/uL (ref 0.7–4.0)
Lymphocytes Relative: 65 %
MCH: 33.3 pg (ref 26.0–34.0)
MCHC: 33.6 g/dL (ref 30.0–36.0)
MCV: 99.2 fL (ref 80.0–100.0)
Monocytes Absolute: 0.3 10*3/uL (ref 0.1–1.0)
Monocytes Relative: 6 %
NRBC: 0 % (ref 0.0–0.2)
Neutro Abs: 0.9 10*3/uL — ABNORMAL LOW (ref 1.7–7.7)
Neutrophils Relative %: 24 %
Platelets: 145 10*3/uL — ABNORMAL LOW (ref 150–400)
RBC: 3.81 MIL/uL — ABNORMAL LOW (ref 4.22–5.81)
RDW: 11.8 % (ref 11.5–15.5)
WBC: 4 10*3/uL (ref 4.0–10.5)

## 2018-10-05 LAB — MAGNESIUM: Magnesium: 2.1 mg/dL (ref 1.7–2.4)

## 2018-10-05 NOTE — ED Notes (Signed)
Patient Alert and oriented to baseline. Stable and ambulatory to baseline. Patient verbalized understanding of the discharge instructions.  Patient belongings were taken by the patient.   

## 2018-10-05 NOTE — ED Provider Notes (Signed)
Emergency Department Provider Note   I have reviewed the triage vital signs and the nursing notes.   HISTORY  Chief Complaint heart racing    HPI Warren Jones is a 28 y.o. male without significant past medical history who presents the emergency department today with palpitations.  Patient states for last couple days he is noticed episodes where his heart feels very irregular.  He smokes marijuana but has not changed the amount.  No new drugs.  Smokes a few cigarettes a day.  No over-the-counter medications in the last week.  No alcohol.  No stimulants.  No change in caffeine use, diet, nausea or vomiting.  No diarrhea or constipation.  No other associated symptoms.  Actual chest pain. No other associated or modifying symptoms.    History reviewed. No pertinent past medical history.  There are no active problems to display for this patient.   History reviewed. No pertinent surgical history.    Allergies Patient has no known allergies.  Family History  Problem Relation Age of Onset  . Healthy Mother     Social History Social History   Tobacco Use  . Smoking status: Current Every Day Smoker    Packs/day: 0.50    Types: Cigarettes  . Smokeless tobacco: Never Used  Substance Use Topics  . Alcohol use: Yes    Comment: 2 bottles of liquor daily  . Drug use: Yes    Types: Marijuana, Cocaine    Review of Systems  All other systems negative except as documented in the HPI. All pertinent positives and negatives as reviewed in the HPI. ____________________________________________   PHYSICAL EXAM:  VITAL SIGNS: ED Triage Vitals  Enc Vitals Group     BP 10/04/18 2339 138/90     Pulse Rate 10/04/18 2339 64     Resp 10/04/18 2339 14     Temp 10/04/18 2339 97.8 F (36.6 C)     Temp Source 10/04/18 2339 Oral     SpO2 10/04/18 2339 100 %    Constitutional: Alert and oriented. Well appearing and in no acute distress. Eyes: Conjunctivae are normal. PERRL.  EOMI. Head: Atraumatic. Nose: No congestion/rhinnorhea. Mouth/Throat: Mucous membranes are moist.  Oropharynx non-erythematous. Neck: No stridor.  No meningeal signs.   Cardiovascular: sLightly bradycardic rate, irregular rhythm. Good peripheral circulation. Grossly normal heart sounds.   Respiratory: Normal respiratory effort.  No retractions. Lungs CTAB. Gastrointestinal: Soft and nontender. No distention.  usculoskeletal: No lower extremity tenderness nor edema. No gross deformities of extremities. Neurologic:  Normal speech and language. No gross focal neurologic deficits are appreciated.  Skin:  Skin is warm, dry and intact. No rash noted.  ____________________________________________   LABS (all labs ordered are listed, but only abnormal results are displayed)  Labs Reviewed  CBC WITH DIFFERENTIAL/PLATELET - Abnormal; Notable for the following components:      Result Value   RBC 3.81 (*)    Hemoglobin 12.7 (*)    HCT 37.8 (*)    Platelets 145 (*)    Neutro Abs 0.9 (*)    All other components within normal limits  COMPREHENSIVE METABOLIC PANEL - Abnormal; Notable for the following components:   BUN <5 (*)    All other components within normal limits  MAGNESIUM   ____________________________________________  EKG   EKG Interpretation  Date/Time:  Saturday October 05 2018 02:26:08 EDT Ventricular Rate:  51 PR Interval:    QRS Duration: 101 QT Interval:  387 QTC Calculation: 357 R Axis:  110 Text Interpretation:  Sinus rhythm Borderline prolonged PR interval Borderline right axis deviation No acute changes Confirmed by Marily Memos [phone]) on 10/05/2018 5:55:29 AM      INITIAL IMPRESSION / ASSESSMENT AND PLAN / ED COURSE  Patient with multiple PVCs on my evaluation on the monitor.  No more than 4 in a row.  Will evaluate electrolytes if normal follow-up with cardiology.  Patient with a couple episodes of prolonged PVCs but no hypotension.  Discussed with  cardiology, Dr. Lauraine Rinne, who stated the patient needed to get a Holter monitor but no reason for admission.  Discussed with the patient, amatory referral to cardiology placed.  Will return here for any syncope, shortness of breath, chest pain, weakness, vomiting or other symptoms.  Pertinent labs & imaging results that were available during my care of the patient were reviewed by me and considered in my medical decision making (see chart for details).  ____________________________________________  FINAL CLINICAL IMPRESSION(S) / ED DIAGNOSES  Final diagnoses:  PVC's (premature ventricular contractions)  Palpitations     MEDICATIONS GIVEN DURING THIS VISIT:  Medications - No data to display   NEW OUTPATIENT MEDICATIONS STARTED DURING THIS VISIT:  There are no discharge medications for this patient.   Note:  This note was prepared with assistance of Dragon voice recognition software. Occasional wrong-word or sound-a-like substitutions may have occurred due to the inherent limitations of voice recognition software.   Markice Torbert, Barbara Cower, MD 10/05/18 [phone]

## 2018-10-08 LAB — PATHOLOGIST SMEAR REVIEW

## 2018-12-03 ENCOUNTER — Ambulatory Visit: Payer: Self-pay | Admitting: Cardiology

## 2018-12-03 NOTE — Progress Notes (Deleted)
 Cardiology Office Note:    Date:     ID:  Warren Jones, DOB [birth year], MRN [id number]  PCP:  Patient, No Pcp Per  Cardiologist:  No primary care provider on file.  Electrophysiologist:  None   Referring MD: Marily Memos, MD     History of Present Illness:    Warren Jones is a 28 y.o. male here for the evaluation of palpitations, PVCs at the request of Dr. Clayborne Dana.  Regular MJ use.  Smokes a few cigarettes a day.  Denied any alcohol, +cocaine previously in Nov 02, 2015. Died at ER visit stimulants, over-the-counter medications.  No chest pain.  Multiple PVCs noted on the monitor.  No more than 4 in a row are noted.  Past Medical History:  Diagnosis Date  . Palpitations   . PVC (premature ventricular contraction)     Past Surgical History:  Procedure Laterality Date  . NO PAST SURGERIES      Current Medications: No outpatient medications have been marked as taking for the  encounter (Appointment) with Jake Bathe, MD.     Allergies:   Patient has no known allergies.   Social History   Socioeconomic History  . Marital status: Single    Spouse name: Not on file  . Number of children: Not on file  . Years of education: Not on file  . Highest education level: Not on file  Occupational History  . Not on file  Social Needs  . Financial resource strain: Not on file  . Food insecurity:    Worry: Not on file    Inability: Not on file  . Transportation needs:    Medical: Not on file    Non-medical: Not on file  Tobacco Use  . Smoking status: Current Every Day Smoker    Packs/day: 0.50    Types: Cigarettes  . Smokeless tobacco: Never Used  Substance and Sexual Activity  . Alcohol use: Yes    Comment: 2 bottles of liquor daily  . Drug use: Yes    Types: Marijuana, Cocaine  . Sexual activity: Not on file  Lifestyle  . Physical activity:    Days per week: Not on file    Minutes per session: Not on file  . Stress: Not on file  Relationships   . Social connections:    Talks on phone: Not on file    Gets together: Not on file    Attends religious service: Not on file    Active member of club or organization: Not on file    Attends meetings of clubs or organizations: Not on file    Relationship status: Not on file  Other Topics Concern  . Not on file  Social History Narrative  . Not on file     Family History: The patient's ***family history includes Healthy in his mother.  ROS:   Please see the history of present illness.    *** All other systems reviewed and are negative.  EKGs/Labs/Other Studies Reviewed:    The following studies were reviewed today: ***  EKG:  EKG is *** ordered today.  The ekg ordered today demonstrates *** 10/05/18 - SB 51, isolated PVC noted. Normal intervals.   Recent Labs: 10/05/2018: ALT 13; BUN <5; Creatinine, Ser 1.01; Hemoglobin 12.7; Magnesium 2.1; Platelets 145; Potassium 3.6; Sodium 138  Recent Lipid Panel No results found for: CHOL, TRIG, HDL, CHOLHDL, VLDL, LDLCALC, LDLDIRECT  Physical Exam:    VS:  There were no vitals taken for this  visit.    Wt Readings from Last 3 Encounters:  02/19/18 175 lb (79.4 kg)  04/24/16 160 lb (72.6 kg)  04/14/13 165 lb (74.8 kg)     GEN: *** Well nourished, well developed in no acute distress HEENT: Normal NECK: No JVD; No carotid bruits LYMPHATICS: No lymphadenopathy CARDIAC: ***RRR, no murmurs, rubs, gallops RESPIRATORY:  Clear to auscultation without rales, wheezing or rhonchi  ABDOMEN: Soft, non-tender, non-distended MUSCULOSKELETAL:  No edema; No deformity  SKIN: Warm and dry NEUROLOGIC:  Alert and oriented x 3 PSYCHIATRIC:  Normal affect   ASSESSMENT:    No diagnosis found. PLAN:    In order of problems listed above:  PVC's/palpitations  - Will check ECHO to ensure proper structure and function of the heart.  Tobacco cessation  - continue to encourage cessation.      Medication Adjustments/Labs and Tests Ordered:  Current medicines are reviewed at length with the patient today.  Concerns regarding medicines are outlined above.  No orders of the defined types were placed in this encounter.  No orders of the defined types were placed in this encounter.   There are no Patient Instructions on file for this visit.   Signed, Donato SchultzMark Renzo Vincelette, MD   8:07 AM    [facility]

## 2018-12-16 DEATH — deceased
